# Patient Record
Sex: Male | Born: 1990 | Race: Black or African American | Hispanic: No | Marital: Single | Smoking: Current every day smoker
Health system: Southern US, Community
[De-identification: ages and names within clinical notes are randomized; demographics above are authoritative.]

## PROBLEM LIST (undated history)

## (undated) DIAGNOSIS — J45909 Unspecified asthma, uncomplicated: Secondary | ICD-10-CM

---

## 2018-08-07 ENCOUNTER — Encounter (HOSPITAL_COMMUNITY): Payer: Self-pay | Admitting: Emergency Medicine

## 2018-08-07 ENCOUNTER — Emergency Department (HOSPITAL_COMMUNITY)
Admission: EM | Admit: 2018-08-07 | Discharge: 2018-08-08 | Disposition: A | Discharge status: Home / Self Care | Payer: Self-pay | Attending: Emergency Medicine | Admitting: Emergency Medicine

## 2018-08-07 ENCOUNTER — Other Ambulatory Visit: Payer: Self-pay

## 2018-08-07 DIAGNOSIS — Y93H9 Activity, other involving exterior property and land maintenance, building and construction: Secondary | ICD-10-CM | POA: Insufficient documentation

## 2018-08-07 DIAGNOSIS — W19XXXA Unspecified fall, initial encounter: Secondary | ICD-10-CM

## 2018-08-07 DIAGNOSIS — S0990XA Unspecified injury of head, initial encounter: Secondary | ICD-10-CM

## 2018-08-07 DIAGNOSIS — Y999 Unspecified external cause status: Secondary | ICD-10-CM | POA: Insufficient documentation

## 2018-08-07 DIAGNOSIS — S0181XA Laceration without foreign body of other part of head, initial encounter: Secondary | ICD-10-CM

## 2018-08-07 DIAGNOSIS — Y929 Unspecified place or not applicable: Secondary | ICD-10-CM | POA: Insufficient documentation

## 2018-08-07 DIAGNOSIS — F17210 Nicotine dependence, cigarettes, uncomplicated: Secondary | ICD-10-CM | POA: Insufficient documentation

## 2018-08-07 DIAGNOSIS — R51 Headache: Secondary | ICD-10-CM | POA: Insufficient documentation

## 2018-08-07 DIAGNOSIS — R202 Paresthesia of skin: Secondary | ICD-10-CM | POA: Insufficient documentation

## 2018-08-07 DIAGNOSIS — W11XXXA Fall on and from ladder, initial encounter: Secondary | ICD-10-CM | POA: Insufficient documentation

## 2018-08-07 HISTORY — DX: Unspecified asthma, uncomplicated: J45.909

## 2018-08-07 NOTE — ED Notes (Signed)
Bed: [id number] Expected date:  Expected time:  Means of arrival:  Comments: EMS fell 5 feet off ladder with significant forehead lacs

## 2018-08-07 NOTE — ED Triage Notes (Signed)
Patient feel to the ground from a 5 ft ladder and hit his head on rocks. It was reprted tghat he suffered 2 lacerations to the head, a hematoma to the head and opened a healing wound on the right arm.

## 2018-08-08 ENCOUNTER — Emergency Department (HOSPITAL_COMMUNITY): Payer: Self-pay

## 2018-08-08 MED ORDER — BACITRACIN ZINC 500 UNIT/GM EX OINT
TOPICAL_OINTMENT | CUTANEOUS | Status: AC
  Administered 2018-08-08: 05:00:00
  Filled 2018-08-08: qty 0.9

## 2018-08-08 MED ORDER — LIDOCAINE-EPINEPHRINE (PF) 2 %-1:200000 IJ SOLN
20.0000 mL | Freq: Once | INTRAMUSCULAR | Status: AC
  Administered 2018-08-08: 20 mL via INTRADERMAL
  Filled 2018-08-08: qty 20

## 2018-08-08 NOTE — Discharge Instructions (Signed)
Keep area clean by washing with soap and water daily. Apply a bandage at least once daily, change more often if it is dirty Watch for signs of infection (redness, drainage, worsening pain) Have stitches removed in 5 days. There are 13 total After stitches are removed keep wound moisturized and protected from the sun to prevent scarring

## 2018-08-08 NOTE — ED Provider Notes (Signed)
[facility]-EMERGENCY DEPT Provider Note   CSN: [id number] Arrival date & time: 08/07/18  2324     History   Chief Complaint Chief Complaint  Patient presents with  . Fall  . Laceration    HPI Darren Hicks is a 27 y.o. male who presents with a fall and forehead laceration. No significant PMH. The patient states that he was on a ladder helping a friend with a roofing project and the ladder wasn't positioned right and fell backwards.  He fell and hit the front of his head on rocks.  He sustained several forehead lacerations and swelling of his head.  He denies loss of consciousness, dizziness, nausea or vomiting.  He denies any neck or back pain.  He had an old wound on his right forearm which opened up a little bit.  He has some tingling from his right forearm down to his fingers.  He has full range of motion of his upper and lower extremities.  Has been ambulatory.  He reports being updated on tetanus.  HPI  Past Medical History:  Diagnosis Date  . Asthma     There are no active problems to display for this patient.   History reviewed. No pertinent surgical history.      Home Medications    Prior to Admission medications   Not on File    Family History History reviewed. No pertinent family history.  Social History Social History   Tobacco Use  . Smoking status: Current Every Day Smoker  . Smokeless tobacco: Former Engineer, water Use Topics  . Alcohol use: Not on file  . Drug use: Not on file     Allergies   Shellfish allergy   Review of Systems Review of Systems  Eyes: Negative for visual disturbance.  Musculoskeletal: Negative for arthralgias, back pain, myalgias and neck pain.  Skin: Positive for wound.  Neurological: Positive for numbness (Tingling) and headaches. Negative for dizziness and weakness.  All other systems reviewed and are negative.    Physical Exam Updated Vital Signs BP 131/76 (BP Location: Left Arm)    Pulse 71   Temp 98.8 F (37.1 C) (Oral)   Resp 19   Ht 5\' 7"  (1.702 m)   Wt 72.6 kg   SpO2 98%   BMI 25.06 kg/m   Physical Exam  Constitutional: He is oriented to person, place, and time. He appears well-developed and well-nourished. No distress.  Calm and cooperative  HENT:  Head: Normocephalic.  2 jagged lacerations over the anterior forehead approximately 4 cm long  Eyes: Pupils are equal, round, and reactive to light. Conjunctivae are normal. Right eye exhibits no discharge. Left eye exhibits no discharge. No scleral icterus.  Neck: Normal range of motion.  No midline neck tenderness  Cardiovascular: Normal rate and regular rhythm.  Pulmonary/Chest: Effort normal and breath sounds normal. No respiratory distress.  Abdominal: He exhibits no distension.  Musculoskeletal:  Full range of motion of the upper and lower extremities.  Old scabbed over wound over the posterior forearm.  Full range of motion of right fingers.  2+ radial pulse  Neurological: He is alert and oriented to person, place, and time.  Skin: Skin is warm and dry.  Psychiatric: He has a normal mood and affect. His behavior is normal.  Nursing note and vitals reviewed.    ED Treatments / Results  Labs (all labs ordered are listed, but only abnormal results are displayed) Labs Reviewed - No data to display  EKG None  Radiology Ct Head Wo Contrast  Result Date: 08/08/2018 CLINICAL DATA:  Fall from ladder with head injury. EXAM: CT HEAD WITHOUT CONTRAST TECHNIQUE: Contiguous axial images were obtained from the base of the skull through the vertex without intravenous contrast. COMPARISON:  None. FINDINGS: Brain: No evidence of parenchymal hemorrhage or extra-axial fluid collection. No mass lesion, mass effect, or midline shift. No CT evidence of acute infarction. Cerebral volume is age appropriate. No ventriculomegaly. Vascular: No acute abnormality. Skull: No evidence of calvarial fracture. Sinuses/Orbits:  Mucoperiosteal thickening throughout the left greater than right maxillary sinuses. No fluid levels. 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 The mastoid air cells are unopacified. IMPRESSION: 1. 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 2. No evidence of acute intracranial abnormality. No evidence of calvarial fracture. 3. Chronic appearing bilateral maxillary sinusitis, left greater than right. Electronically Signed   By: Delbert Phenix M.D.   On: 08/08/2018 02:31    Procedures .Marland KitchenLaceration Repair Date/Time: 08/08/2018 3:00 AM Performed by: Bethel Born, PA-C Authorized by: Bethel Born, PA-C   Consent:    Consent obtained:  Verbal   Consent given by:  Patient   Risks discussed:  Infection and pain   Alternatives discussed:  No treatment Anesthesia (see MAR for exact dosages):    Anesthesia method:  Local infiltration   Local anesthetic:  Lidocaine 2% WITH epi Laceration details:    Location:  Face   Face location:  Forehead   Length (cm):  4   Depth (mm):  5 Repair type:    Repair type:  Simple Pre-procedure details:    Preparation:  Patient was prepped and draped in usual sterile fashion Exploration:    Wound exploration: wound explored through full range of motion and entire depth of wound probed and visualized     Wound extent: no foreign bodies/material noted and no underlying fracture noted   Treatment:    Area cleansed with:  Soap and water   Amount of cleaning:  Standard   Irrigation method:  Syringe   Visualized foreign bodies/material removed: no   Skin repair:    Repair method:  Sutures   Suture size:  6-0   Suture material:  Prolene   Suture technique:  Simple interrupted   Number of sutures:  7 Approximation:    Approximation:  Close Post-procedure details:    Dressing:  Antibiotic ointment and sterile dressing   Patient tolerance of procedure:  Tolerated well, no immediate  complications .Marland KitchenLaceration Repair Date/Time: 08/08/2018 3:00 AM Performed by: Bethel Born, PA-C Authorized by: Bethel Born, PA-C   Consent:    Consent obtained:  Verbal   Consent given by:  Patient   Risks discussed:  Infection and pain   Alternatives discussed:  No treatment Anesthesia (see MAR for exact dosages):    Anesthesia method:  Local infiltration Laceration details:    Location:  Face   Face location:  Forehead   Length (cm):  4   Depth (mm):  5 Repair type:    Repair type:  Simple Pre-procedure details:    Preparation:  Patient was prepped and draped in usual sterile fashion Exploration:    Wound exploration: wound explored through full range of motion and entire depth of wound probed and visualized     Wound extent: no underlying fracture noted     Contaminated: no   Treatment:    Area cleansed with:  Soap and water  Amount of cleaning:  Standard   Irrigation method:  Tap   Visualized foreign bodies/material removed: no   Skin repair:    Repair method:  Sutures   Suture size:  6-0   Suture material:  Prolene   Suture technique:  Simple interrupted   Number of sutures:  6 Approximation:    Approximation:  Close Post-procedure details:    Dressing:  Antibiotic ointment and sterile dressing   Patient tolerance of procedure:  Tolerated well, no immediate complications   (including critical care time)    Medications Ordered in ED Medications  lidocaine-EPINEPHrine (XYLOCAINE W/EPI) 2 %-1:200000 (PF) injection 20 mL (20 mLs Intradermal Given 08/08/18 0223)     Initial Impression / Assessment and Plan / ED Course  I have reviewed the triage vital signs and the nursing notes.  Pertinent labs & imaging results that were available during my care of the patient were reviewed by me and considered in my medical decision making (see chart for details).  27 year old male presents with fall, head injury, and 2 forehead lacerations. His vitals are  normal. Although he does not have significant pain and has not had dizziness, vomiting, the mechanism sounds severe enough that CT head was ordered. This was negative. His two forehead lacerations were repaired and irrigated. 13 sutures were placed. Bottom of the wound visualized and bleeding controlled. Wound care discussed and advised to return to have stitches removed in 5 days. Return precautions discussed.   Final Clinical Impressions(s) / ED Diagnoses   Final diagnoses:  Fall, initial encounter  Injury of head, initial encounter  Laceration of forehead, initial encounter    ED Discharge Orders    None       Bethel Born, PA-C 08/08/18 2224    Devoria Albe, MD 08/10/18 2258

## 2018-08-12 ENCOUNTER — Other Ambulatory Visit: Payer: Self-pay

## 2018-08-12 ENCOUNTER — Emergency Department (HOSPITAL_COMMUNITY)
Admission: EM | Admit: 2018-08-12 | Discharge: 2018-08-12 | Disposition: A | Discharge status: Home / Self Care | Payer: Self-pay | Attending: Emergency Medicine | Admitting: Emergency Medicine

## 2018-08-12 DIAGNOSIS — S0181XD Laceration without foreign body of other part of head, subsequent encounter: Secondary | ICD-10-CM | POA: Insufficient documentation

## 2018-08-12 DIAGNOSIS — Z711 Person with feared health complaint in whom no diagnosis is made: Secondary | ICD-10-CM | POA: Insufficient documentation

## 2018-08-12 DIAGNOSIS — X58XXXA Exposure to other specified factors, initial encounter: Secondary | ICD-10-CM | POA: Insufficient documentation

## 2018-08-12 DIAGNOSIS — Z5189 Encounter for other specified aftercare: Secondary | ICD-10-CM

## 2018-08-12 DIAGNOSIS — Z202 Contact with and (suspected) exposure to infections with a predominantly sexual mode of transmission: Secondary | ICD-10-CM | POA: Insufficient documentation

## 2018-08-12 DIAGNOSIS — F17210 Nicotine dependence, cigarettes, uncomplicated: Secondary | ICD-10-CM | POA: Insufficient documentation

## 2018-08-12 MED ORDER — CEFTRIAXONE SODIUM 250 MG IJ SOLR
250.0000 mg | Freq: Once | INTRAMUSCULAR | Status: AC
  Administered 2018-08-12: 250 mg via INTRAMUSCULAR
  Filled 2018-08-12: qty 250

## 2018-08-12 MED ORDER — STERILE WATER FOR INJECTION IJ SOLN
INTRAMUSCULAR | Status: AC
  Administered 2018-08-12: 10 mL
  Filled 2018-08-12: qty 10

## 2018-08-12 MED ORDER — AZITHROMYCIN 250 MG PO TABS
1000.0000 mg | ORAL_TABLET | Freq: Once | ORAL | Status: AC
  Administered 2018-08-12: 1000 mg via ORAL
  Filled 2018-08-12: qty 4

## 2018-08-12 MED ORDER — ONDANSETRON 4 MG PO TBDP
4.0000 mg | ORAL_TABLET | Freq: Once | ORAL | Status: AC
  Administered 2018-08-12: 4 mg via ORAL
  Filled 2018-08-12: qty 1

## 2018-08-12 NOTE — Discharge Instructions (Addendum)
Please return in 1-2 days for suture removal.    Today you have been treated for gonorrhea and chlamydia.  The test to determine if you have these will take a few days. They will only call you if your tests come back positive, no news is good news. In the result that your tests are positive you have already been treated.  Please avoid sexual activity for 2 week.  Your partner will need treatment.  If you vomit in the 3 hours after taking your pills you will need a repeat dose of pills.  Please call back the emergency room and they should be able to call in meds for you.

## 2018-08-12 NOTE — ED Triage Notes (Signed)
Patient presented to ed for suture removal and want to be seen also for STD check.
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
## 2018-08-13 LAB — GC/CHLAMYDIA PROBE AMP (~~LOC~~) NOT AT ARMC
Chlamydia: NEGATIVE
NEISSERIA GONORRHEA: POSITIVE — AB

## 2018-08-14 ENCOUNTER — Emergency Department (HOSPITAL_COMMUNITY)
Admission: EM | Admit: 2018-08-14 | Discharge: 2018-08-14 | Disposition: A | Discharge status: Home / Self Care | Payer: Self-pay | Attending: Emergency Medicine | Admitting: Emergency Medicine

## 2018-08-14 DIAGNOSIS — F17200 Nicotine dependence, unspecified, uncomplicated: Secondary | ICD-10-CM | POA: Insufficient documentation

## 2018-08-14 DIAGNOSIS — J45909 Unspecified asthma, uncomplicated: Secondary | ICD-10-CM | POA: Insufficient documentation

## 2018-08-14 DIAGNOSIS — Z4802 Encounter for removal of sutures: Secondary | ICD-10-CM | POA: Insufficient documentation

## 2018-08-14 MED ORDER — BACITRACIN-NEOMYCIN-POLYMYXIN 400-5-5000 EX OINT
TOPICAL_OINTMENT | Freq: Once | CUTANEOUS | Status: AC
  Administered 2018-08-14: 1 via TOPICAL
  Filled 2018-08-14: qty 1

## 2018-08-14 NOTE — ED Triage Notes (Signed)
Pt here to have sutures removed from a head laceration. Sutures were placed on 9/25. Pt denies any pain at this time. No signs of infection noted.

## 2018-08-14 NOTE — ED Notes (Signed)
Bed: WTR9 Expected date:  Expected time:  Means of arrival:  Comments: 

## 2018-08-14 NOTE — Discharge Instructions (Signed)
Bacitracin to area, massage gently.

## 2018-08-14 NOTE — ED Provider Notes (Signed)
[facility]-EMERGENCY DEPT Provider Note   CSN: [id number] Arrival date & time: 08/14/18  1228     History   Chief Complaint Chief Complaint  Patient presents with  . Suture / Staple Removal    HPI Darren Hicks is a 27 y.o. male.  28 year old male presents for suture removal.  Sutures placed in the scalp on 08/07/18.  No complaints or concerns.  Tetanus is up-to-date.     Past Medical History:  Diagnosis Date  . Asthma     There are no active problems to display for this patient.   No past surgical history on file.      Home Medications    Prior to Admission medications   Not on File    Family History No family history on file.  Social History Social History   Tobacco Use  . Smoking status: Current Every Day Smoker  . Smokeless tobacco: Former Engineer, water Use Topics  . Alcohol use: Not on file  . Drug use: Not on file     Allergies   Shellfish allergy   Review of Systems Review of Systems  Constitutional: Negative for fever.  Eyes: Negative for visual disturbance.  Musculoskeletal: Negative for neck pain and neck stiffness.  Skin: Positive for wound.  Allergic/Immunologic: Negative for immunocompromised state.  Neurological: Negative for weakness, numbness and headaches.  Hematological: Does not bruise/bleed easily.  All other systems reviewed and are negative.    Physical Exam Updated Vital Signs BP 126/76 (BP Location: Right Arm)   Pulse 63   Temp 98.7 F (37.1 C) (Oral)   Resp 14   Ht 5\' 7"  (1.702 m)   Wt 74.8 kg   SpO2 96%   BMI 25.84 kg/m   Physical Exam  Constitutional: He is oriented to person, place, and time. He appears well-developed and well-nourished. No distress.  HENT:  Head: Normocephalic.    Pulmonary/Chest: Effort normal.  Neurological: He is alert and oriented to person, place, and time.  Skin: Skin is warm and dry. He is not diaphoretic.  Psychiatric: He has a normal mood and  affect. His behavior is normal.  Nursing note and vitals reviewed.    ED Treatments / Results  Labs (all labs ordered are listed, but only abnormal results are displayed) Labs Reviewed - No data to display  EKG None  Radiology No results found.  Procedures .Suture Removal Date/Time: 08/14/2018 1:31 PM Performed by: Jeannie Fend, PA-C Authorized by: Jeannie Fend, PA-C   Consent:    Consent obtained:  Verbal   Consent given by:  Patient   Risks discussed:  Bleeding, pain and wound separation   Alternatives discussed:  No treatment Location:    Location:  Head/neck   Head/neck location:  Forehead Procedure details:    Wound appearance:  No signs of infection   Sutures removed: 6, 7. Post-procedure details:    Post-removal:  Antibiotic ointment applied   Patient tolerance of procedure:  Tolerated well, no immediate complications   (including critical care time)  Medications Ordered in ED Medications  neomycin-bacitracin-polymyxin (NEOSPORIN) ointment (has no administration in time range)     Initial Impression / Assessment and Plan / ED Course  I have reviewed the triage vital signs and the nursing notes.  Pertinent labs & imaging results that were available during my care of the patient were reviewed by me and considered in my medical decision making (see chart for details).    Final Clinical Impressions(s) /  ED Diagnoses   Final diagnoses:  Visit for suture removal    ED Discharge Orders    None       Jeannie Fend, PA-C 08/14/18 1335    Cathren Laine, MD 08/15/18 1234

## 2019-09-07 IMAGING — CT CT HEAD W/O CM
3 of 4 series · 15 of 47 positions shown, 18 images · non-contrast
Comparison: None.

CLINICAL DATA: Fall from ladder with head injury.

EXAM:
CT HEAD WITHOUT CONTRAST
TECHNIQUE: Contiguous axial images were obtained from the base of the skull
through the vertex without intravenous contrast.

[Series 2: head wo · axial · 0.47mm/px · z∈[+1534,+1654]mm · 9 of 32 slices shown, 12 images]
[im 4/32  brain]
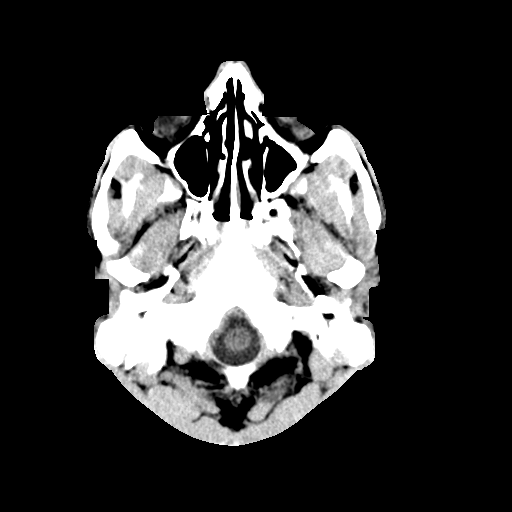
[im 4/32  bone]
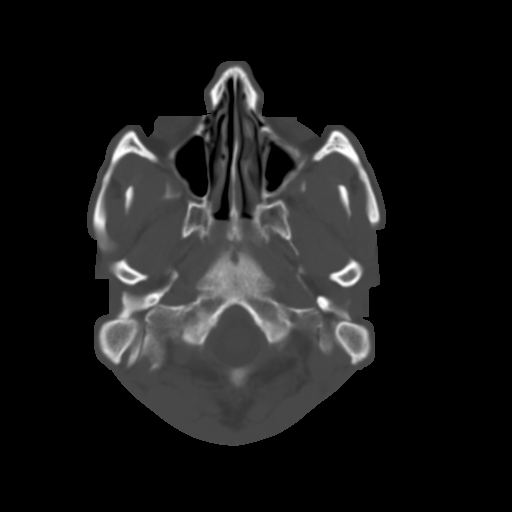
[im 7/32  brain]
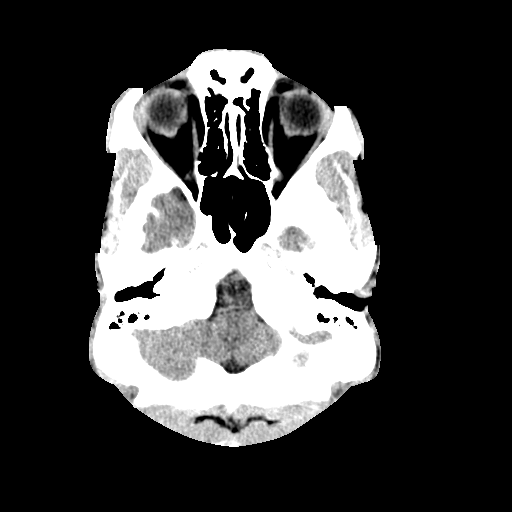
[im 10/32  brain]
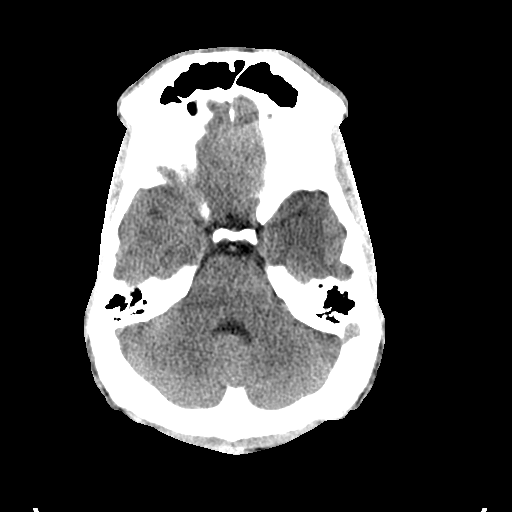
[im 13/32  brain]
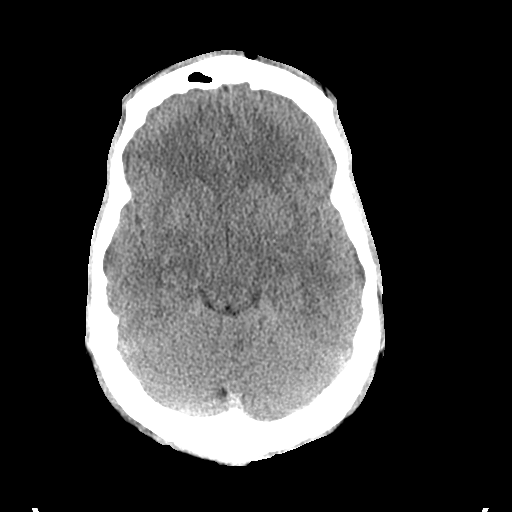
[im 16/32  brain]
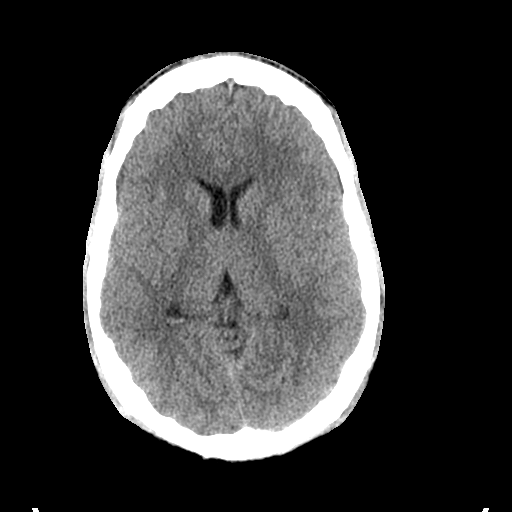
[im 16/32  bone]
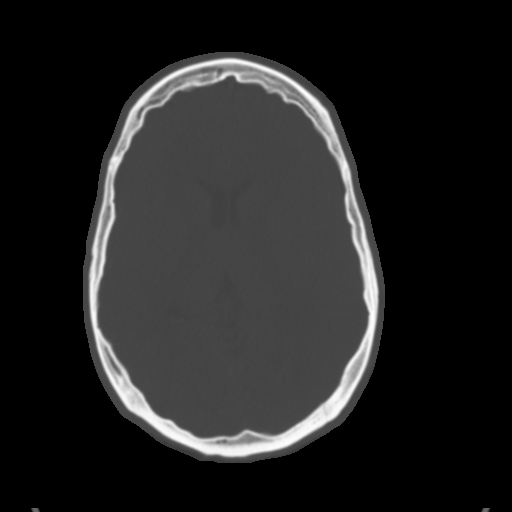
[im 19/32  brain]
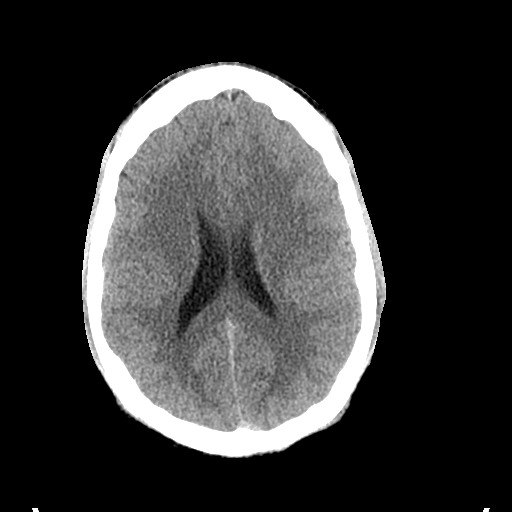
[im 22/32  brain]
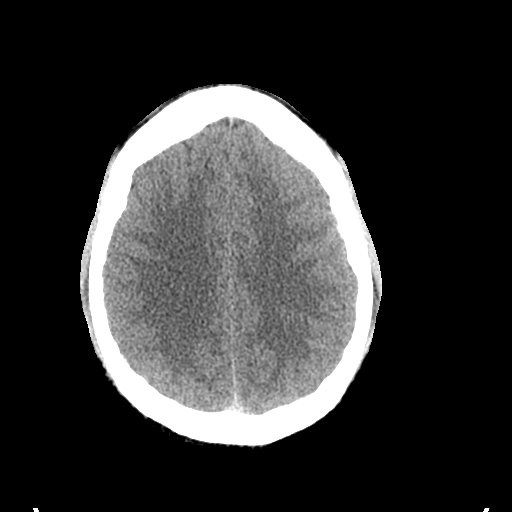
[im 25/32  brain]
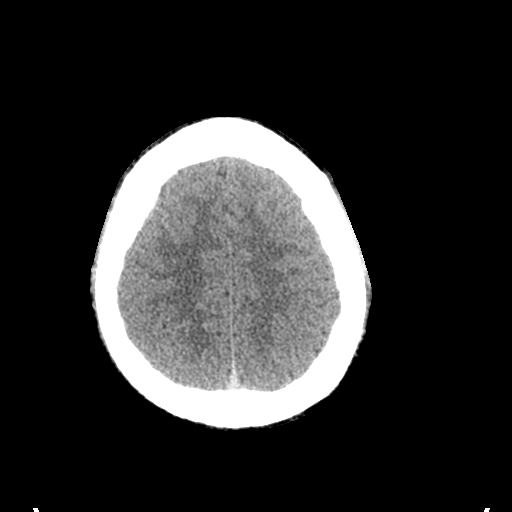
[im 28/32  brain]
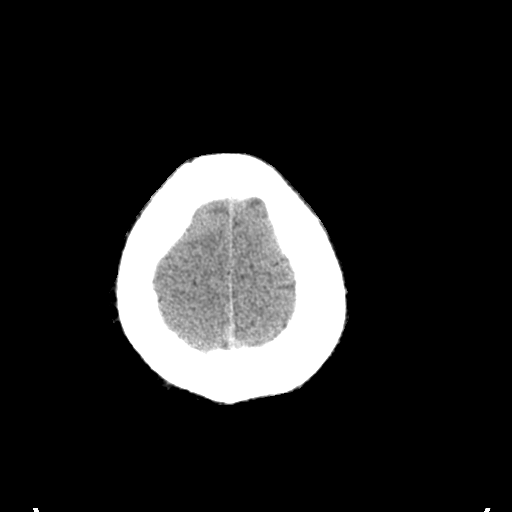
[im 28/32  bone]
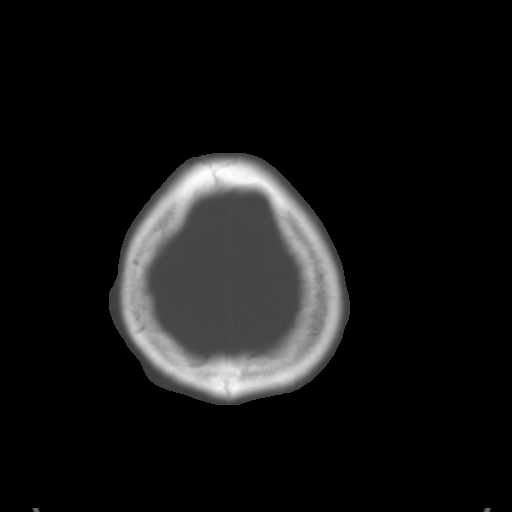

[Series 4: coronal soft tissue · coronal · 0.28mm/px · 3 of 66 slices shown]
[im 22/66  brain]
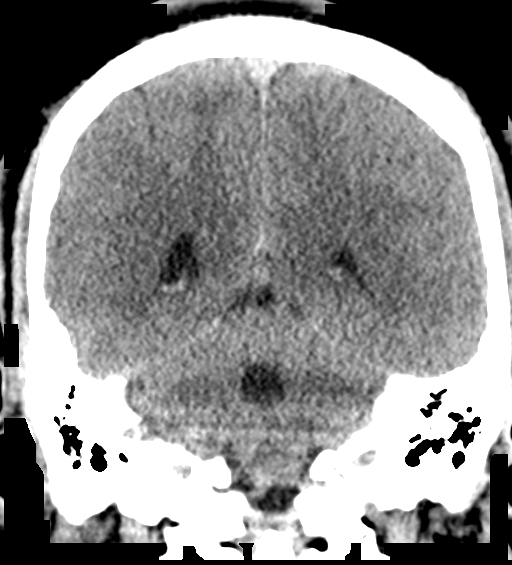
[im 29/66  brain]
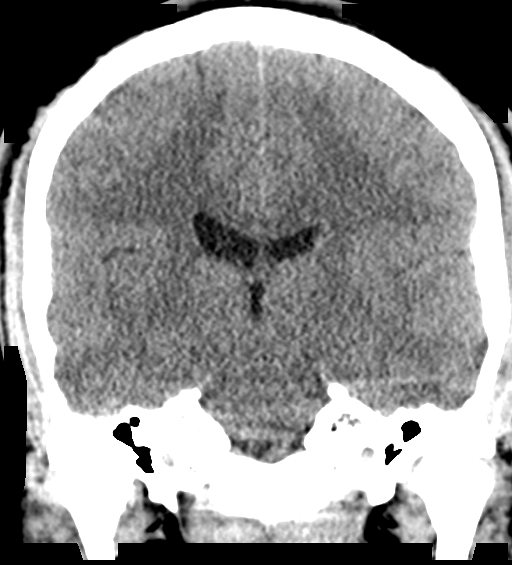
[im 37/66  brain]
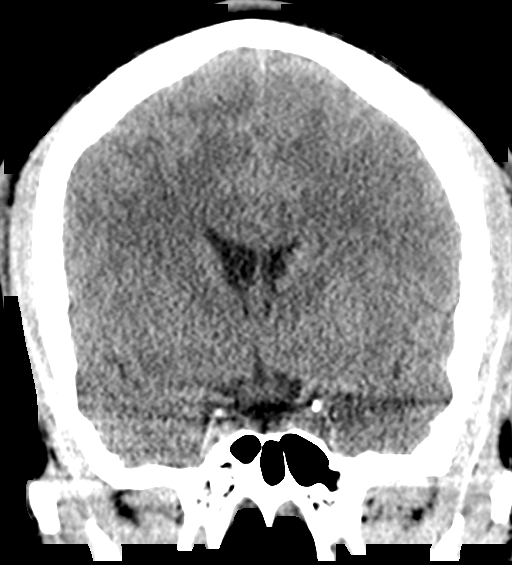

[Series 5: sagittal soft tissue · sagittal · 0.31mm/px · 3 of 49 slices shown]
[im 17/49  brain]
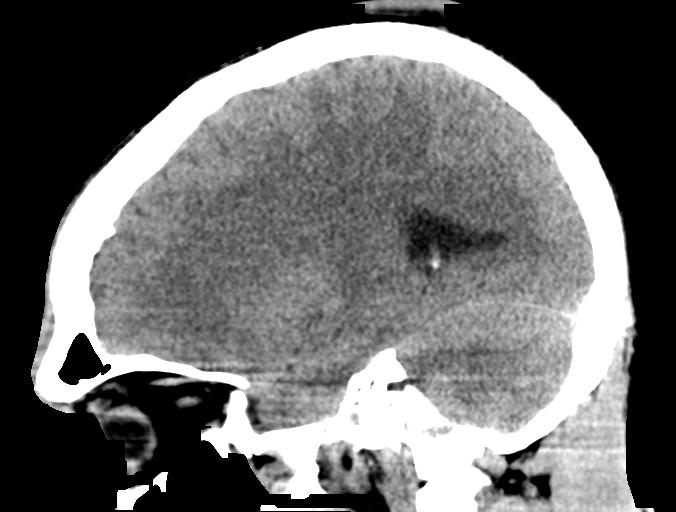
[im 25/49  brain]
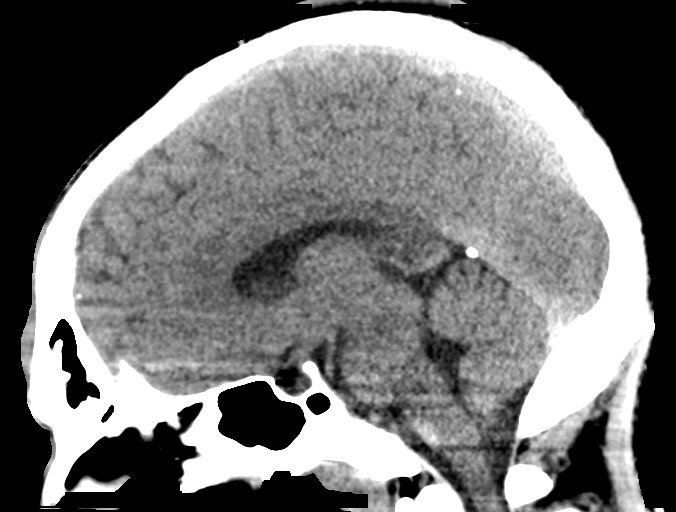
[im 33/49  brain]
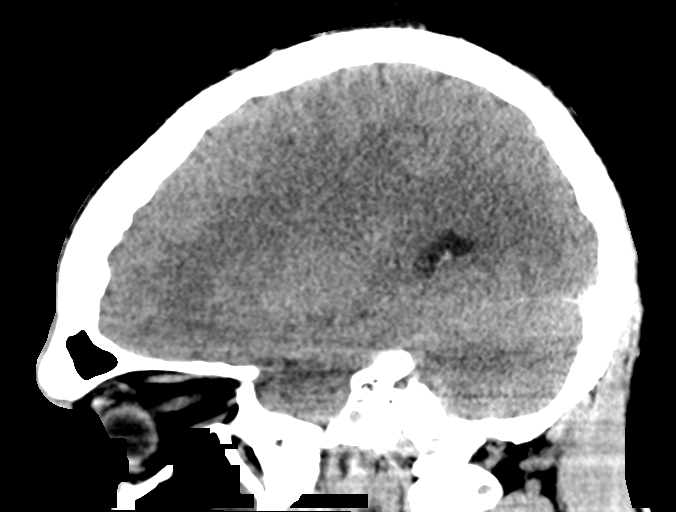

[15 of 47 positions shown; findings below may reference images not displayed]

FINDINGS: Brain: No evidence of parenchymal hemorrhage or extra-axial fluid
collection. No mass lesion, mass effect, or midline shift. No CT
evidence of acute infarction. Cerebral volume is age appropriate. No
ventriculomegaly.

Vascular: No acute abnormality.

Skull: No evidence of calvarial fracture.

Sinuses/Orbits: Mucoperiosteal thickening throughout the left
greater than right maxillary sinuses. No fluid levels.

Other: Small anterior frontal scalp hematoma with associated scalp
emphysema. The mastoid air cells are unopacified.
IMPRESSION: 1. Small anterior frontal scalp hematoma with associated scalp
emphysema.
2. No evidence of acute intracranial abnormality. No evidence of
calvarial fracture.
3. Chronic appearing bilateral maxillary sinusitis, left greater
than right.
